# Patient Record
Sex: Female | Born: 1975 | Hispanic: Yes | Marital: Married | Smoking: Never smoker
Health system: Southern US, Community
[De-identification: ages and names within clinical notes are randomized; demographics above are authoritative.]

## PROBLEM LIST (undated history)

## (undated) DIAGNOSIS — R7303 Prediabetes: Secondary | ICD-10-CM

## (undated) DIAGNOSIS — E7800 Pure hypercholesterolemia, unspecified: Secondary | ICD-10-CM

---

## 2004-12-03 ENCOUNTER — Encounter: Admission: RE | Admit: 2004-12-03 | Discharge: 2004-12-03 | Payer: Self-pay | Admitting: Specialist

## 2005-10-20 ENCOUNTER — Ambulatory Visit: Payer: Self-pay | Admitting: Obstetrics & Gynecology

## 2006-03-30 ENCOUNTER — Inpatient Hospital Stay: Payer: Self-pay

## 2010-01-25 ENCOUNTER — Emergency Department: Payer: Self-pay | Admitting: Emergency Medicine

## 2011-03-07 ENCOUNTER — Emergency Department: Payer: Self-pay | Admitting: Emergency Medicine

## 2015-06-30 ENCOUNTER — Other Ambulatory Visit: Payer: Self-pay | Admitting: Family Medicine

## 2015-06-30 DIAGNOSIS — Z1231 Encounter for screening mammogram for malignant neoplasm of breast: Secondary | ICD-10-CM

## 2015-07-09 ENCOUNTER — Ambulatory Visit
Admission: RE | Admit: 2015-07-09 | Discharge: 2015-07-09 | Disposition: A | Discharge status: Home / Self Care | Payer: BLUE CROSS/BLUE SHIELD | Source: Ambulatory Visit | Attending: Family Medicine | Admitting: Family Medicine

## 2015-07-09 DIAGNOSIS — Z1231 Encounter for screening mammogram for malignant neoplasm of breast: Secondary | ICD-10-CM | POA: Insufficient documentation

## 2016-08-11 ENCOUNTER — Other Ambulatory Visit: Payer: Self-pay | Admitting: Family Medicine

## 2016-08-11 DIAGNOSIS — R1903 Right lower quadrant abdominal swelling, mass and lump: Secondary | ICD-10-CM

## 2016-08-11 DIAGNOSIS — R32 Unspecified urinary incontinence: Secondary | ICD-10-CM

## 2016-08-11 DIAGNOSIS — R1900 Intra-abdominal and pelvic swelling, mass and lump, unspecified site: Secondary | ICD-10-CM

## 2016-08-18 ENCOUNTER — Ambulatory Visit: Payer: BLUE CROSS/BLUE SHIELD

## 2016-10-05 ENCOUNTER — Emergency Department: Payer: BLUE CROSS/BLUE SHIELD

## 2016-10-05 ENCOUNTER — Emergency Department
Admission: EM | Admit: 2016-10-05 | Discharge: 2016-10-05 | Disposition: A | Discharge status: Home / Self Care | Payer: BLUE CROSS/BLUE SHIELD | Attending: Emergency Medicine | Admitting: Emergency Medicine

## 2016-10-05 ENCOUNTER — Encounter: Payer: Self-pay | Admitting: Emergency Medicine

## 2016-10-05 DIAGNOSIS — Y929 Unspecified place or not applicable: Secondary | ICD-10-CM | POA: Diagnosis not present

## 2016-10-05 DIAGNOSIS — S93402A Sprain of unspecified ligament of left ankle, initial encounter: Secondary | ICD-10-CM | POA: Diagnosis not present

## 2016-10-05 DIAGNOSIS — W1839XA Other fall on same level, initial encounter: Secondary | ICD-10-CM | POA: Insufficient documentation

## 2016-10-05 DIAGNOSIS — Y9301 Activity, walking, marching and hiking: Secondary | ICD-10-CM | POA: Insufficient documentation

## 2016-10-05 DIAGNOSIS — Y999 Unspecified external cause status: Secondary | ICD-10-CM | POA: Diagnosis not present

## 2016-10-05 DIAGNOSIS — S8992XA Unspecified injury of left lower leg, initial encounter: Secondary | ICD-10-CM | POA: Diagnosis present

## 2016-10-05 HISTORY — DX: Prediabetes: R73.03

## 2016-10-05 HISTORY — DX: Pure hypercholesterolemia, unspecified: E78.00

## 2016-10-05 MED ORDER — HYDROCODONE-ACETAMINOPHEN 5-325 MG PO TABS
ORAL_TABLET | ORAL | Status: AC
  Administered 2016-10-05: 1 via ORAL
  Filled 2016-10-05: qty 1

## 2016-10-05 MED ORDER — HYDROCODONE-ACETAMINOPHEN 5-325 MG PO TABS
1.0000 | ORAL_TABLET | Freq: Once | ORAL | Status: AC
  Administered 2016-10-05: 1 via ORAL

## 2016-10-05 NOTE — ED Triage Notes (Signed)
Patient presents to the ED with left knee and left ankle pain post fall.  Patient states she was walking her dog and she slipped on the wet grass and fell.  Patient states she was dragged slightly by her dog after the fall.  Patient states she was able to stand after the fall but her left leg felt, "heavier than normal", she had pain to her left ankle, and she reports hearing a "pop" in her knee.  Patient is in no obvious distress at this time.

## 2016-10-05 NOTE — ED Provider Notes (Signed)
[facility] Emergency Department Provider Note  ____________________________________________  Time seen: Approximately 9:08 AM  I have reviewed the triage vital signs and the nursing notes.   HISTORY  Chief Complaint Ankle Pain and Knee Pain    HPI Heather Herring is a 41 y.o. female that presents to the emergency department with lower left leg pain after falling this morning. Patient was out walking her dog when the dog pulled her to the ground. She landed on her right side but twisted her left leg and heard a pop. She immediately got back up and walked the rest of the way home. After she got home she noticed that her leg started to feel "heavy." She has not taken anything for pain. She denies hitting head or losing consciousness. No shortness of breath, chest pain, nausea, vomiting, abdominal pain.   Past Medical History:  Diagnosis Date  . High cholesterol   . Pre-diabetes     There are no active problems to display for this patient.   History reviewed. No pertinent surgical history.  Prior to Admission medications   Not on File    Allergies Patient has no known allergies.  Family History  Problem Relation Age of Onset  . Breast cancer Neg Hx     Social History Social History  Substance Use Topics  . Smoking status: Never Smoker  . Smokeless tobacco: Never Used  . Alcohol use No     Review of Systems  Cardiovascular: No chest pain. Respiratory: No SOB. Gastrointestinal: No abdominal pain.  No nausea, no vomiting.  Musculoskeletal: Positive for knee pain and ankle pain. Skin: Negative for rash, abrasions, lacerations, ecchymosis. Neurological: Negative for headaches, numbness or tingling   ____________________________________________   PHYSICAL EXAM:  VITAL SIGNS: ED Triage Vitals  Enc Vitals Group     BP 10/05/16 0853 (!) 140/92     Pulse Rate 10/05/16 0853 72     Resp 10/05/16 0853 18     Temp 10/05/16 0853 98.7 F (37.1  C)     Temp Source 10/05/16 0853 Oral     SpO2 10/05/16 0853 99 %     Weight 10/05/16 0849 210 lb (95.3 kg)     Height 10/05/16 0849  (1.6 m)     Head Circumference --      Peak Flow --      Pain Score 10/05/16 0849 8     Pain Loc --      Pain Edu? --      Excl. in GC? --      Constitutional: Alert and oriented. Well appearing and in no acute distress. Eyes: Conjunctivae are normal. PERRL. EOMI. Head: Atraumatic. ENT:      Ears:      Nose: No congestion/rhinnorhea.      Mouth/Throat: Mucous membranes are moist.  Neck: No stridor.  Cardiovascular: Normal rate, regular rhythm.  Good peripheral circulation. Respiratory: Normal respiratory effort without tachypnea or retractions. Lungs CTAB. Good air entry to the bases with no decreased or absent breath sounds. Musculoskeletal: Full range of motion to all extremities. No gross deformities appreciated. Tenderness to palpation over lateral malleolus. Mild swelling. No ecchymosis.  Neurologic:  Normal speech and language. No gross focal neurologic deficits are appreciated.  Skin:  Skin is warm, dry and intact. No rash noted.   ____________________________________________   LABS (all labs ordered are listed, but only abnormal results are displayed)  Labs Reviewed - No data to display ____________________________________________  EKG  ____________________________________________  RADIOLOGY Lexine Baton, personally viewed and evaluated these images (plain radiographs) as part of my medical decision making, as well as reviewing the written report by the radiologist.  Dg Tibia/fibula Left  Result Date: 10/05/2016 CLINICAL DATA:  Fall. EXAM: LEFT TIBIA AND FIBULA - 2 VIEW COMPARISON:  None. FINDINGS: There is no evidence of fracture or other focal bone lesions. Soft tissues are unremarkable. IMPRESSION: Negative. Electronically Signed   By: Signa Kell M.D.   On: 10/05/2016 10:03   Dg Ankle Complete Left  Result  Date: 10/05/2016 CLINICAL DATA:  Pain following fall EXAM: LEFT ANKLE COMPLETE - 3+ VIEW COMPARISON:  None FINDINGS: Frontal, oblique, and lateral views obtained. There is moderate soft tissue swelling laterally. There is no fracture. There is a joint effusion. There is no appreciable joint space narrowing or erosion. The ankle mortise appears intact. There are posterior and inferior calcaneal spurs. IMPRESSION: No fracture. There is soft tissue swelling with joint effusion. Suspect ligamentous injury. There are calcaneal spurs posteriorly and inferiorly. Electronically Signed   By: Bretta Bang III M.D.   On: 10/05/2016 10:46    ____________________________________________    PROCEDURES  Procedure(s) performed:    Procedures    Medications  HYDROcodone-acetaminophen (NORCO/VICODIN) 5-325 MG per tablet 1 tablet (1 tablet Oral Given 10/05/16 0924)     ____________________________________________   INITIAL IMPRESSION / ASSESSMENT AND PLAN / ED COURSE  Pertinent labs & imaging results that were available during my care of the patient were reviewed by me and considered in my medical decision making (see chart for details).  Review of the [place] CSRS was performed in accordance of the [facility] prior to dispensing any controlled drugs.     Patient's diagnosis is consistent with ankle sprain.  Vital signs and exam are reassuring. Tibia/fibula x-ray and ankle x-ray negative for acute bony abnormalities. Ankle was ace wrapped and crutches were given. Education was provided. Patient is to follow up with PCP as directed. Patient is given ED precautions to return to the ED for any worsening or new symptoms.     ____________________________________________  FINAL CLINICAL IMPRESSION(S) / ED DIAGNOSES  Final diagnoses:  Sprain of left ankle, unspecified ligament, initial encounter      NEW MEDICATIONS STARTED DURING THIS VISIT:  There are no discharge medications for this  patient.       This chart was dictated using voice recognition software/Dragon. Despite best efforts to proofread, errors can occur which can change the meaning. Any change was purely unintentional.    Enid Derry, PA-C 10/05/16 1127    Rockne Menghini, MD 10/05/16 1459

## 2016-10-05 NOTE — ED Notes (Signed)
See triage note   States she slipped and fell while walking her dog  Having pain from left knee into lower leg  Increased apin with palpation  Positive pulses

## 2019-11-21 ENCOUNTER — Other Ambulatory Visit: Payer: Self-pay

## 2019-11-21 DIAGNOSIS — E109 Type 1 diabetes mellitus without complications: Secondary | ICD-10-CM

## 2019-11-21 NOTE — Congregational Nurse Program (Signed)
Pt wanted discuss education on diabetes. Pt glucose was 320. States she will make an appointment with PCP regarding an annual appointment and to get a medication refill.

## 2021-05-05 ENCOUNTER — Other Ambulatory Visit: Payer: Self-pay | Admitting: Family Medicine

## 2021-05-05 DIAGNOSIS — R748 Abnormal levels of other serum enzymes: Secondary | ICD-10-CM

## 2021-05-21 ENCOUNTER — Ambulatory Visit
Admission: RE | Admit: 2021-05-21 | Discharge: 2021-05-21 | Disposition: A | Discharge status: Home / Self Care | Payer: No Typology Code available for payment source | Source: Ambulatory Visit | Attending: Family Medicine | Admitting: Family Medicine

## 2021-05-21 ENCOUNTER — Other Ambulatory Visit: Payer: Self-pay

## 2021-05-21 DIAGNOSIS — R748 Abnormal levels of other serum enzymes: Secondary | ICD-10-CM | POA: Diagnosis not present

## 2022-10-26 ENCOUNTER — Telehealth: Payer: Self-pay | Admitting: *Deleted

## 2023-01-09 ENCOUNTER — Other Ambulatory Visit: Payer: Self-pay | Admitting: Family Medicine

## 2023-01-09 DIAGNOSIS — Z1231 Encounter for screening mammogram for malignant neoplasm of breast: Secondary | ICD-10-CM

## 2023-01-20 ENCOUNTER — Ambulatory Visit
Admission: RE | Admit: 2023-01-20 | Discharge: 2023-01-20 | Disposition: A | Discharge status: Home / Self Care | Payer: 59 | Source: Ambulatory Visit | Attending: Family Medicine | Admitting: Family Medicine

## 2023-01-20 ENCOUNTER — Ambulatory Visit
Admission: RE | Admit: 2023-01-20 | Discharge: 2023-01-20 | Disposition: A | Discharge status: Home / Self Care | Payer: 59 | Source: Ambulatory Visit | Attending: Physician Assistant | Admitting: Physician Assistant

## 2023-01-20 ENCOUNTER — Other Ambulatory Visit: Payer: Self-pay | Admitting: Physician Assistant

## 2023-01-20 DIAGNOSIS — Z1231 Encounter for screening mammogram for malignant neoplasm of breast: Secondary | ICD-10-CM | POA: Diagnosis present

## 2023-01-20 DIAGNOSIS — R2232 Localized swelling, mass and lump, left upper limb: Secondary | ICD-10-CM | POA: Insufficient documentation

## 2023-01-24 ENCOUNTER — Other Ambulatory Visit: Payer: Self-pay | Admitting: Nurse Practitioner

## 2023-01-24 ENCOUNTER — Other Ambulatory Visit: Payer: Self-pay | Admitting: Physician Assistant

## 2023-01-24 DIAGNOSIS — R921 Mammographic calcification found on diagnostic imaging of breast: Secondary | ICD-10-CM

## 2023-01-24 DIAGNOSIS — R748 Abnormal levels of other serum enzymes: Secondary | ICD-10-CM

## 2023-01-24 DIAGNOSIS — R928 Other abnormal and inconclusive findings on diagnostic imaging of breast: Secondary | ICD-10-CM

## 2023-01-24 DIAGNOSIS — R1012 Left upper quadrant pain: Secondary | ICD-10-CM

## 2023-01-30 ENCOUNTER — Ambulatory Visit
Admission: RE | Admit: 2023-01-30 | Discharge: 2023-01-30 | Disposition: A | Discharge status: Home / Self Care | Payer: 59 | Source: Ambulatory Visit | Attending: Nurse Practitioner | Admitting: Nurse Practitioner

## 2023-01-30 DIAGNOSIS — R928 Other abnormal and inconclusive findings on diagnostic imaging of breast: Secondary | ICD-10-CM | POA: Diagnosis present

## 2023-01-30 DIAGNOSIS — R921 Mammographic calcification found on diagnostic imaging of breast: Secondary | ICD-10-CM | POA: Insufficient documentation

## 2023-02-02 ENCOUNTER — Ambulatory Visit: Payer: 59

## 2023-02-02 ENCOUNTER — Ambulatory Visit
Admission: RE | Admit: 2023-02-02 | Discharge: 2023-02-02 | Disposition: A | Discharge status: Home / Self Care | Payer: 59 | Source: Ambulatory Visit | Attending: Physician Assistant | Admitting: Physician Assistant

## 2023-02-02 DIAGNOSIS — R1012 Left upper quadrant pain: Secondary | ICD-10-CM | POA: Diagnosis present

## 2023-02-02 DIAGNOSIS — R748 Abnormal levels of other serum enzymes: Secondary | ICD-10-CM | POA: Insufficient documentation

## 2023-07-12 ENCOUNTER — Encounter: Payer: Self-pay | Admitting: Nurse Practitioner

## 2023-07-14 ENCOUNTER — Other Ambulatory Visit: Payer: Self-pay | Admitting: Nurse Practitioner

## 2023-07-14 DIAGNOSIS — R921 Mammographic calcification found on diagnostic imaging of breast: Secondary | ICD-10-CM

## 2023-08-03 IMAGING — US US ABDOMEN COMPLETE
1 series · 14 of 25 positions shown · non-contrast
Comparison: None.

CLINICAL DATA: Acid phosphatase elevated

EXAM:
ABDOMEN ULTRASOUND COMPLETE

[Series 1: us abdomen complete · 0.26mm/px · 14 of 92 slices shown]
[im 1/92]
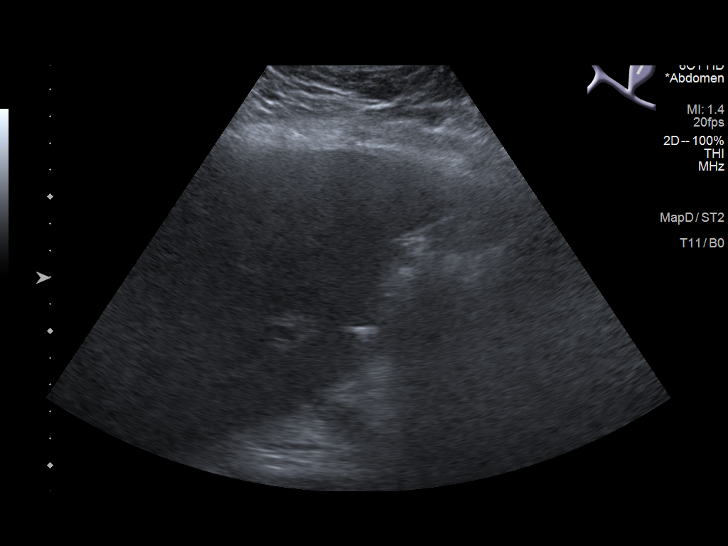
[im 8/92]
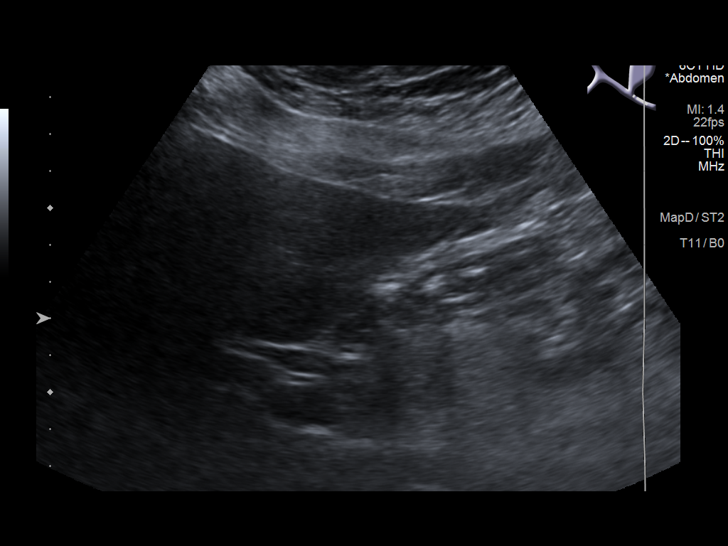
[im 16/92]
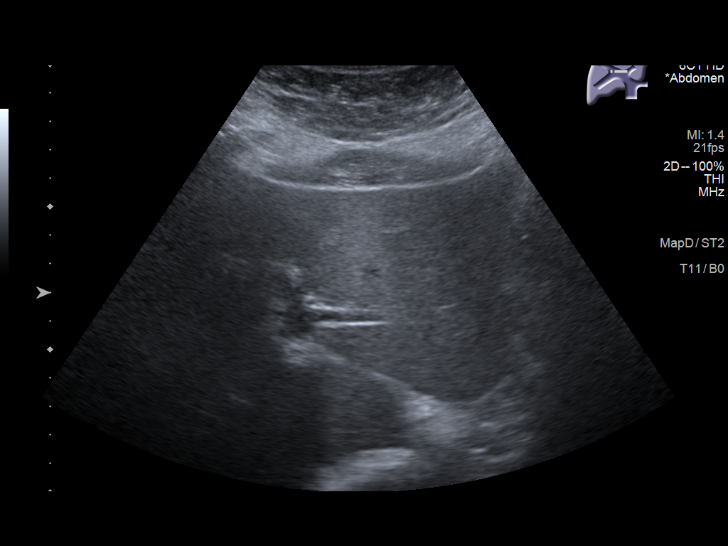
[im 23/92]
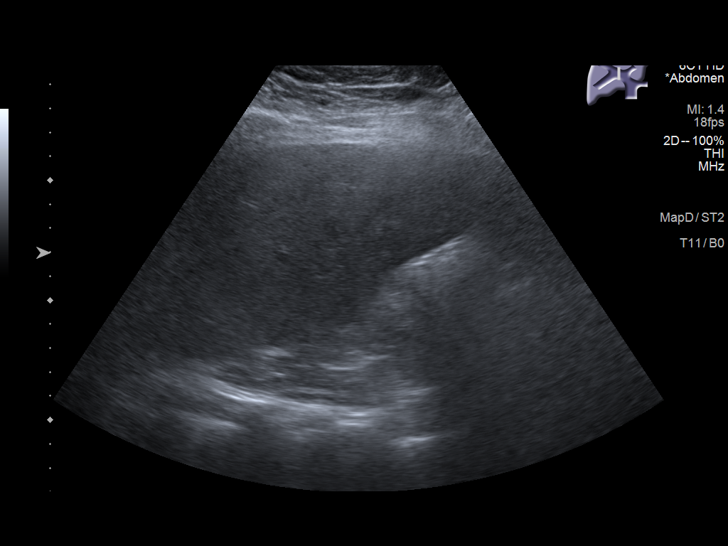
[im 31/92]
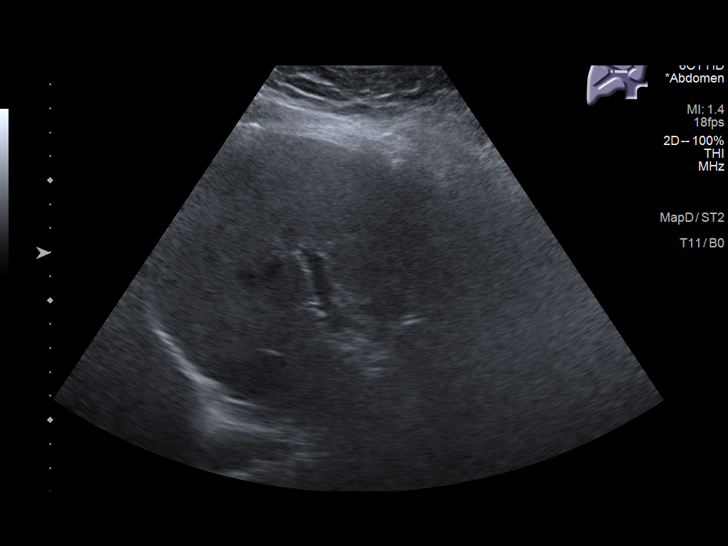
[im 35/92]
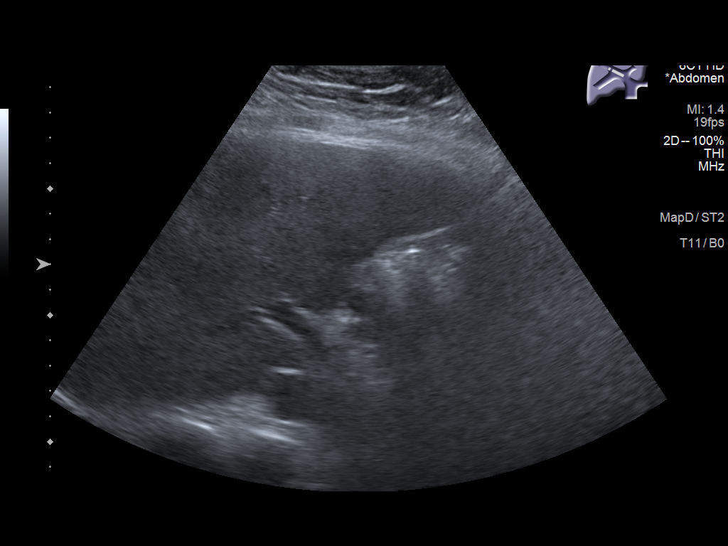
[im 42/92]
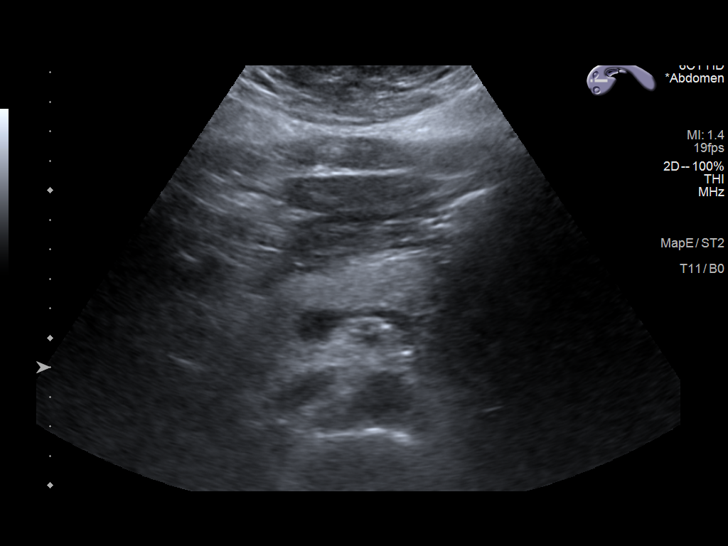
[im 50/92]
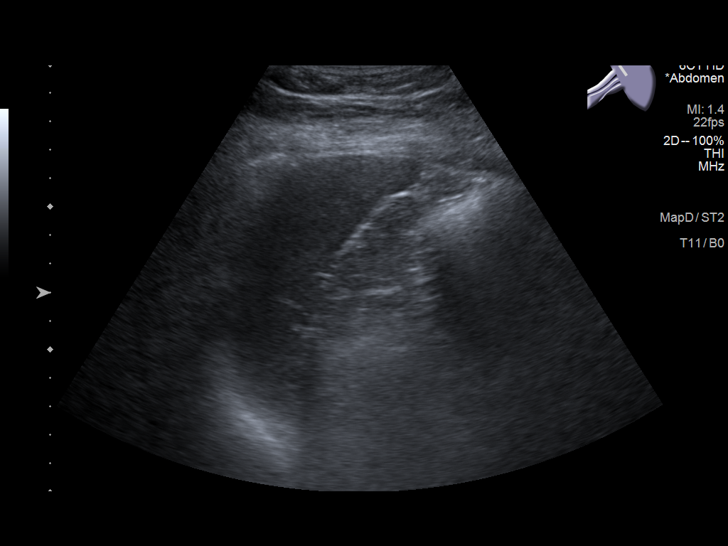
[im 57/92]
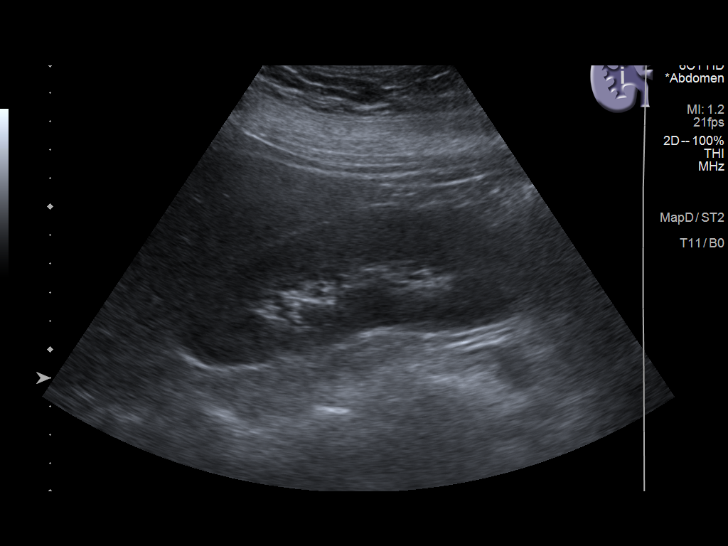
[im 61/92]
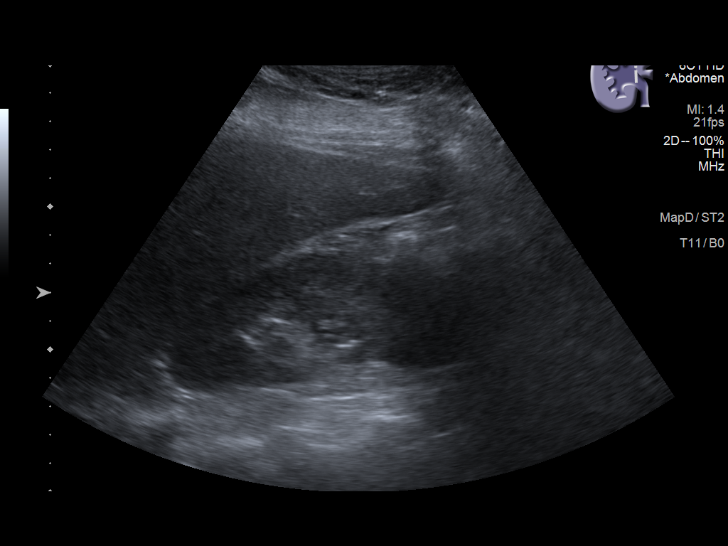
[im 69/92]
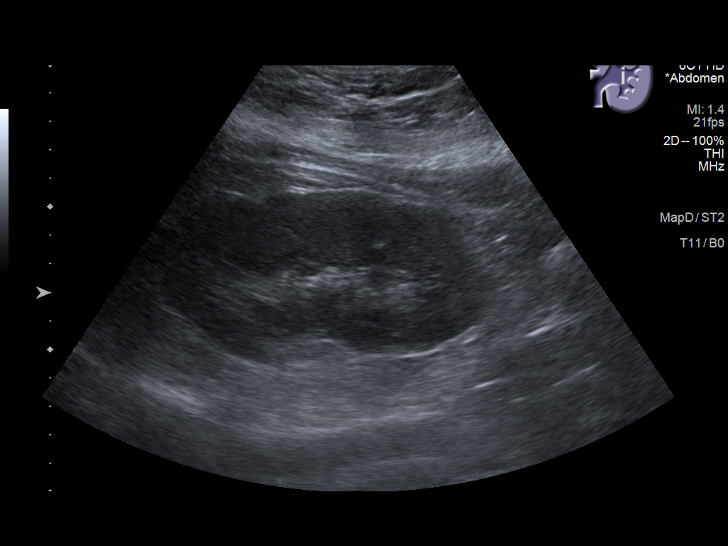
[im 76/92]
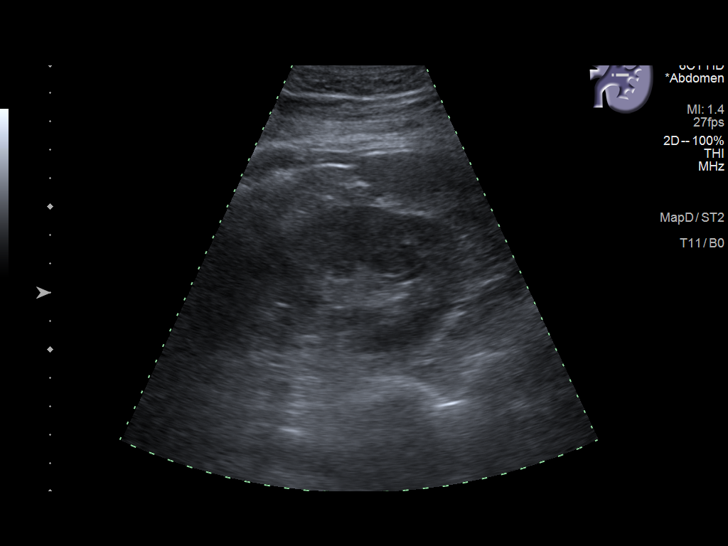
[im 84/92]
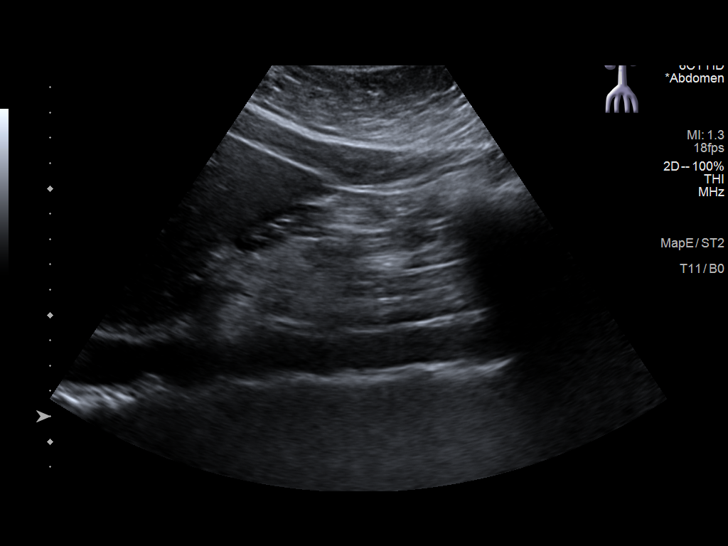
[im 92/92]
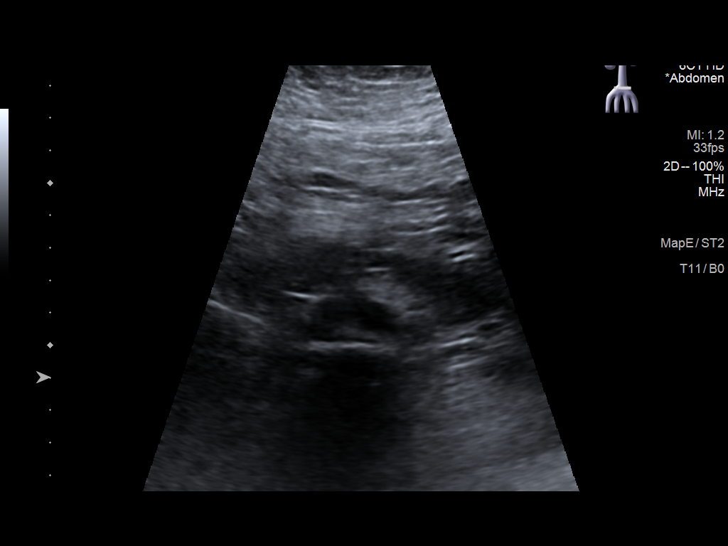

[14 of 25 positions shown; findings below may reference images not displayed]

FINDINGS: Gallbladder: Surgically absent.

Common bile duct: Diameter: 0.6 cm, within normal limits

Liver: No focal lesion identified. Within normal limits in
parenchymal echogenicity. Portal vein is patent on color Doppler
imaging with normal direction of blood flow towards the liver.

IVC: No abnormality visualized.

Pancreas: Visualized portion unremarkable.

Spleen: Size and appearance within normal limits.

Right Kidney: Length: 12.1 cm. Echogenicity within normal limits. No
mass or hydronephrosis visualized.

Left Kidney: Length: 11.4 cm. Echogenicity within normal limits. No
mass or hydronephrosis visualized.

Abdominal aorta: No aneurysm visualized.

Other findings: None.
IMPRESSION: Gallbladder is surgically absent. Otherwise unremarkable sonographic
exam of the abdomen.

## 2023-08-10 ENCOUNTER — Encounter: Payer: Self-pay | Admitting: Radiology

## 2023-08-10 ENCOUNTER — Ambulatory Visit
Admission: RE | Admit: 2023-08-10 | Discharge: 2023-08-10 | Disposition: A | Discharge status: Home / Self Care | Payer: 59 | Source: Ambulatory Visit | Attending: Nurse Practitioner | Admitting: Nurse Practitioner

## 2023-08-10 DIAGNOSIS — R921 Mammographic calcification found on diagnostic imaging of breast: Secondary | ICD-10-CM | POA: Diagnosis present

## 2023-09-18 ENCOUNTER — Other Ambulatory Visit: Payer: Self-pay | Admitting: Family Medicine

## 2023-09-18 DIAGNOSIS — R928 Other abnormal and inconclusive findings on diagnostic imaging of breast: Secondary | ICD-10-CM

## 2024-02-15 ENCOUNTER — Ambulatory Visit
Admission: RE | Admit: 2024-02-15 | Discharge: 2024-02-15 | Disposition: A | Discharge status: Home / Self Care | Source: Ambulatory Visit | Attending: Family Medicine | Admitting: Family Medicine

## 2024-02-15 DIAGNOSIS — R928 Other abnormal and inconclusive findings on diagnostic imaging of breast: Secondary | ICD-10-CM | POA: Diagnosis present
